# Patient Record
Sex: Male | Born: 2000 | Race: White | Hispanic: No | Marital: Single | Smoking: Never smoker
Health system: Southern US, Community
[De-identification: ages and names within clinical notes are randomized; demographics above are authoritative.]

## PROBLEM LIST (undated history)

## (undated) DIAGNOSIS — T7840XA Allergy, unspecified, initial encounter: Secondary | ICD-10-CM

## (undated) DIAGNOSIS — J45909 Unspecified asthma, uncomplicated: Secondary | ICD-10-CM

## (undated) HISTORY — DX: Allergy, unspecified, initial encounter: T78.40XA

## (undated) HISTORY — DX: Unspecified asthma, uncomplicated: J45.909

---
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
## 2020-03-04 ENCOUNTER — Telehealth: Payer: Self-pay | Admitting: Family Medicine

## 2020-03-04 NOTE — Telephone Encounter (Signed)
Patients mom calling to see if DR hunter will take her son on as patient, father is a patient. Please Advise

## 2020-03-04 NOTE — Telephone Encounter (Signed)
Sorry, Aaron Wu. [phone]

## 2020-03-04 NOTE — Telephone Encounter (Signed)
Yes I am accepting family members thanks

## 2020-03-04 NOTE — Telephone Encounter (Signed)
Could you please find out pt parents names so Dr. Durene Cal can be aware.

## 2020-03-04 NOTE — Telephone Encounter (Signed)
Called and LVM for pt to schedule new pt appt

## 2020-03-04 NOTE — Telephone Encounter (Signed)
Please call for app  

## 2020-03-04 NOTE — Telephone Encounter (Signed)
Ok to make new pt app?

## 2020-05-13 ENCOUNTER — Encounter: Payer: Self-pay | Admitting: Family Medicine

## 2020-05-13 ENCOUNTER — Other Ambulatory Visit: Payer: Self-pay

## 2020-05-13 ENCOUNTER — Ambulatory Visit (INDEPENDENT_AMBULATORY_CARE_PROVIDER_SITE_OTHER): Payer: BC Managed Care – PPO | Admitting: Family Medicine

## 2020-05-13 VITALS — BP 110/70 | HR 75 | Temp 98.7°F | Ht 71.0 in | Wt 130.0 lb

## 2020-05-13 DIAGNOSIS — Z0000 Encounter for general adult medical examination without abnormal findings: Secondary | ICD-10-CM | POA: Diagnosis not present

## 2020-05-13 DIAGNOSIS — J4520 Mild intermittent asthma, uncomplicated: Secondary | ICD-10-CM | POA: Diagnosis not present

## 2020-05-13 DIAGNOSIS — J309 Allergic rhinitis, unspecified: Secondary | ICD-10-CM | POA: Insufficient documentation

## 2020-05-13 DIAGNOSIS — J301 Allergic rhinitis due to pollen: Secondary | ICD-10-CM | POA: Diagnosis not present

## 2020-05-13 DIAGNOSIS — J45909 Unspecified asthma, uncomplicated: Secondary | ICD-10-CM | POA: Insufficient documentation

## 2020-05-13 NOTE — Progress Notes (Addendum)
Phone: [phone]   Subjective:  Patient presents today to establish care.  Prior patient of Dr. Alita Chyle (recently retired). Last well child 08/2018.  Chief Complaint  Patient presents with  . est care   See problem oriented charting- Review of Systems  Constitutional: Negative.   HENT: Negative.   Eyes: Negative.   Respiratory: Negative.   Cardiovascular: Negative.   Gastrointestinal: Negative.   Genitourinary: Negative.   Musculoskeletal: Negative.   Skin: Negative.   Neurological: Negative.   Endo/Heme/Allergies: Negative.   Psychiatric/Behavioral: Negative.   - full  review of systems sheet was completed and negative - negative as above  The following were reviewed and entered/updated in epic: Past Medical History:  Diagnosis Date  . Allergies    Dr. Barnetta Chapel- allegra and singulair . also dogs, cats   . Asthma    Dr. Barnetta Chapel. singulair. albuterol monthly or less    Patient Active Problem List   Diagnosis Date Noted  . Asthma     Priority: Medium  . Allergic rhinitis     Priority: Low   History reviewed. No pertinent surgical history.  Family History  Problem Relation Age of Onset  . Healthy Mother   . Hypertension Father   . Healthy Sister   . Healthy Maternal Grandmother   . Healthy Maternal Grandfather        accidental death  . Hypertension Paternal Grandmother   . Depression Paternal Grandmother   . Cancer Paternal Grandfather        stage IV- not sure what original cancer was    Medications- reviewed and updated Current Outpatient Medications  Medication Sig Dispense Refill  . fexofenadine (ALLEGRA) 180 MG tablet Take 180 mg by mouth daily.    . montelukast (SINGULAIR) 10 MG tablet Take 10 mg by mouth at bedtime.     No current facility-administered medications for this visit.    Allergies-reviewed and updated No Known Allergies  Social History   Social History Narrative   Will be on campus- 1 roommate and 6 suitemates.        [employer].    Starts [employer] fall 2021. Enrolled in engineering.       Hobbies: cross country- still enjoys running, Manufacturing systems engineer, basketball rec league      Objective:  BP 110/70   Pulse 75   Temp 98.7 F (37.1 C)   Ht 5\' 11"  (1.803 m)   Wt 130 lb (59 kg)   SpO2 96%   BMI 18.13 kg/m  Gen: NAD, resting comfortably HEENT: Mucous membranes are moist. Oropharynx normal Neck: no thyromegaly CV: RRR no murmurs rubs or gallops Lungs: CTAB no crackles, wheeze, rhonchi Abdomen: soft/nontender/nondistended/normal bowel sounds. No rebound or guarding.  Thin Ext: no edema Skin: warm, dry Neuro: grossly normal, moves all extremities, PERRLA     Assessment and Plan:  19 y.o. male presenting for annual physical.  Health Maintenance counseling: 1. Anticipatory guidance: Patient counseled regading regular dental exams -q6 months, eye exams -no vision issues,  avoiding smoking and second hand smoke , limiting alcohol to 2 beverages per day.   2. Risk factor reduction:  Advised patient of need for regular exercise and diet rich and fruits and vegetables to reduce risk of heart attack and stroke. Exercise- runs for exercise. Diet-very picky eater. Typical day- cheerios most days, pb+j, eats family meal for most part. Low on fruits and veggies. Recommended multivitamin due to limited fruits/veggies and keep exploring to find fruits/veggies you like!  Wt Readings from Last 3 Encounters:  05/13/20 130 lb (59 kg) (15 %, Z= -1.06)*   * Growth percentiles are based on CDC (Boys, 2-20 Years) data.  3. Immunizations/screenings/ancillary studies- up to date. Will abstract in from pediatrician- kind enough to bring records today. Declines hiv and hep c screen.  Immunization History  Administered Date(s) Administered  . DTaP 08/21/2001, 10/21/2001, 12/20/2001, 06/10/2003, 06/10/2003  . Hepatitis A 08/12/2003, 06/22/2004  . PFIZER SARS-COV-2 Vaccination 12/27/2019, 01/17/2020  4. Prostate cancer  screening- no family history, start at age 67 5. Colon cancer screening - no family history, start at age 5 6. Skin cancer screening/prevention- sees dr. Sharyn Lull yearly. advised regular sunscreen use. Denies worrisome, changing, or new skin lesions.  7. Testicular cancer screening- advised monthly self exams  8. STD screening- patient opts out as monogomous 9. Never smoker  Status of chronic or acute concerns   Mild intermittent asthma without complication/allergic rhinitis-does well on Singulair/Allegra.  Sparing albuterol for breathing issues.  Managed by Dr. Gary Fleet  We discussed getting baseline labs such as CBC, CMP, lipid panel-patient declines at this time but may write back if he changes his mind  Low normal BMI-recommended increased caloric intake  Recommended follow up: Return in about 1 year (around 05/13/2021) for physical or sooner if needed. Future Appointments  Date Time Provider Department Center  05/18/2021  9:40 AM Durene Cal, Aldine Contes, MD [facility]   Lab/Order associations:    ICD-10-CM   1. Preventative health care  Z00.00   2. Allergic rhinitis due to pollen, unspecified seasonality  J30.1   3. Mild intermittent asthma without complication  J45.20    Return precautions advised.   Tana Conch, MD

## 2020-05-13 NOTE — Patient Instructions (Addendum)
Health Maintenance Due  Topic Date Due  . Hepatitis C Screening - declines Never done  . HIV Screening - declines Never done  . INFLUENZA VACCINE - recommend flu shot in the fall 05/09/2020   Recommended multivitamin due to limited fruits/veggies and keep exploring to find fruits/veggies you like!    If you change your mind on labs in the next week or so we can certainly set this up for you related to the physical today

## 2020-09-21 DIAGNOSIS — D2271 Melanocytic nevi of right lower limb, including hip: Secondary | ICD-10-CM | POA: Diagnosis not present

## 2020-09-21 DIAGNOSIS — D225 Melanocytic nevi of trunk: Secondary | ICD-10-CM | POA: Diagnosis not present

## 2020-09-21 DIAGNOSIS — L578 Other skin changes due to chronic exposure to nonionizing radiation: Secondary | ICD-10-CM | POA: Diagnosis not present

## 2020-09-21 DIAGNOSIS — L814 Other melanin hyperpigmentation: Secondary | ICD-10-CM | POA: Diagnosis not present

## 2020-10-13 ENCOUNTER — Other Ambulatory Visit: Payer: BC Managed Care – PPO

## 2020-10-14 ENCOUNTER — Other Ambulatory Visit: Payer: BC Managed Care – PPO

## 2020-10-14 DIAGNOSIS — Z20822 Contact with and (suspected) exposure to covid-19: Secondary | ICD-10-CM

## 2020-10-16 LAB — NOVEL CORONAVIRUS, NAA: SARS-CoV-2, NAA: NOT DETECTED

## 2020-10-16 LAB — SARS-COV-2, NAA 2 DAY TAT

## 2021-03-22 ENCOUNTER — Telehealth (INDEPENDENT_AMBULATORY_CARE_PROVIDER_SITE_OTHER): Payer: 59 | Admitting: Family Medicine

## 2021-03-22 DIAGNOSIS — R0981 Nasal congestion: Secondary | ICD-10-CM

## 2021-03-22 DIAGNOSIS — H9202 Otalgia, left ear: Secondary | ICD-10-CM | POA: Diagnosis not present

## 2021-03-22 NOTE — Progress Notes (Signed)
Virtual Visit via Video Note  I connected with Aaron Wu  on 03/22/21 at 11:00 AM EDT by a video enabled telemedicine application and verified that I am speaking with the correct person using two identifiers.  Location patient: home, [place] Location provider:work or home office Persons participating in the virtual visit: patient, provider  I discussed the limitations of evaluation and management by telemedicine and the availability of in person appointments. The patient expressed understanding and agreed to proceed.   HPI:  Acute telemedicine visit for congestion and ear congestion: -Onset:2-3 days -Symptoms include: feels full and muffled, nasal congestion -has not been swimming recently -Denies:fevers, cough, sneezing, pain, drainage from the ear, hearing loss -Has tried: nothing -Pertinent past medical history: seasonal allergies, asthma -Pertinent medication allergies: No Known Allergies -COVID-19 vaccine status:vaccinated x2  ROS: See pertinent positives and negatives per HPI.  Past Medical History:  Diagnosis Date   Allergies    Dr. Barnetta Chapel- allegra and singulair . also dogs, cats    Asthma    Dr. Barnetta Chapel. singulair. albuterol monthly or less     No past surgical history on file.   Current Outpatient Medications:    fexofenadine (ALLEGRA) 180 MG tablet, Take 180 mg by mouth daily., Disp: , Rfl:    montelukast (SINGULAIR) 10 MG tablet, Take 10 mg by mouth at bedtime., Disp: , Rfl:   EXAM:  VITALS per patient if applicable:  GENERAL: alert, oriented, appears well and in no acute distress  HEENT: atraumatic, conjunttiva clear, no obvious abnormalities on inspection of external nose and ears, denies any pain with ear tugging or tragus pull, denies any tenderness to palpation around or behind the ear, no drainage from the ear  NECK: normal movements of the head and neck  LUNGS: on inspection no signs of respiratory distress, breathing rate appears normal, no obvious gross  SOB, gasping or wheezing  CV: no obvious cyanosis  MS: moves all visible extremities without noticeable abnormality  PSYCH/NEURO: pleasant and cooperative, no obvious depression or anxiety, speech and thought processing grossly intact  ASSESSMENT AND PLAN:  Discussed the following assessment and plan:  Discomfort of left ear  Nasal congestion  -we discussed possible serious and likely etiologies, options for evaluation and workup, limitations of telemedicine visit vs in person visit, treatment, treatment risks and precautions. Pt prefers to treat via telemedicine empirically rather than in person at this moment.  Discussed possible eustachian tube dysfunction secondary to viral upper respiratory illness or allergies, versus otitis externa versus cerumen impaction versus other.  He has opted to try a short course of Afrin nasal spray, followed by Flonase for 2 weeks.  Also agrees to do a COVID test.  Advised to seek prompt in person care if worsening, new symptoms arise, or if is not improving with treatment. Discussed options for inperson care if PCP office not available.  I discussed the assessment and treatment plan with the patient. The patient was provided an opportunity to ask questions and all were answered. The patient agreed with the plan and demonstrated an understanding of the instructions.     Terressa Koyanagi, DO

## 2021-03-22 NOTE — Patient Instructions (Signed)
-  Do a short 3-day course of Afrin nasal spray, do not use longer than 3 days.  -Then do a 2-week course of Flonase 2 sprays each nostril daily.  -Check a COVID test  I hope you are feeling better soon!  Seek in person care promptly if your symptoms worsen, new concerns arise or you are not improving with treatment.  It was nice to meet you today. I help [facility] out with telemedicine visits on Tuesdays and Thursdays and am available for visits on those days. If you have any concerns or questions following this visit please schedule a follow up visit with your Primary Care doctor or seek care at a local urgent care clinic to avoid delays in care.

## 2021-05-18 ENCOUNTER — Encounter: Payer: BC Managed Care – PPO | Admitting: Family Medicine

## 2022-07-03 ENCOUNTER — Encounter: Payer: Self-pay | Admitting: *Deleted

## 2022-09-21 ENCOUNTER — Encounter: Payer: Self-pay | Admitting: *Deleted

## 2022-09-28 ENCOUNTER — Encounter: Payer: Self-pay | Admitting: Family Medicine

## 2022-09-28 ENCOUNTER — Other Ambulatory Visit (HOSPITAL_COMMUNITY)
Admission: RE | Admit: 2022-09-28 | Discharge: 2022-09-28 | Disposition: A | Discharge status: Home / Self Care | Payer: 59 | Source: Ambulatory Visit | Attending: Family Medicine | Admitting: Family Medicine

## 2022-09-28 ENCOUNTER — Ambulatory Visit (INDEPENDENT_AMBULATORY_CARE_PROVIDER_SITE_OTHER): Payer: 59 | Admitting: Family Medicine

## 2022-09-28 VITALS — BP 118/79 | HR 72 | Temp 98.6°F | Ht 72.0 in | Wt 148.8 lb

## 2022-09-28 DIAGNOSIS — Z114 Encounter for screening for human immunodeficiency virus [HIV]: Secondary | ICD-10-CM | POA: Diagnosis not present

## 2022-09-28 DIAGNOSIS — Z113 Encounter for screening for infections with a predominantly sexual mode of transmission: Secondary | ICD-10-CM | POA: Insufficient documentation

## 2022-09-28 DIAGNOSIS — Z0000 Encounter for general adult medical examination without abnormal findings: Secondary | ICD-10-CM

## 2022-09-28 DIAGNOSIS — Z130 Encounter for screening for diseases of the blood and blood-forming organs and certain disorders involving the immune mechanism: Secondary | ICD-10-CM

## 2022-09-28 DIAGNOSIS — Z13220 Encounter for screening for lipoid disorders: Secondary | ICD-10-CM | POA: Diagnosis not present

## 2022-09-28 DIAGNOSIS — Z118 Encounter for screening for other infectious and parasitic diseases: Secondary | ICD-10-CM

## 2022-09-28 LAB — CBC WITH DIFFERENTIAL/PLATELET
Basophils Absolute: 0.1 10*3/uL (ref 0.0–0.1)
Basophils Relative: 2 % (ref 0.0–3.0)
Eosinophils Absolute: 1 10*3/uL — ABNORMAL HIGH (ref 0.0–0.7)
Eosinophils Relative: 20.7 % — ABNORMAL HIGH (ref 0.0–5.0)
HCT: 44 % (ref 39.0–52.0)
Hemoglobin: 15.8 g/dL (ref 13.0–17.0)
Lymphocytes Relative: 39.6 % (ref 12.0–46.0)
Lymphs Abs: 1.9 10*3/uL (ref 0.7–4.0)
MCHC: 35.9 g/dL (ref 30.0–36.0)
MCV: 87.1 fl (ref 78.0–100.0)
Monocytes Absolute: 0.3 10*3/uL (ref 0.1–1.0)
Monocytes Relative: 6.3 % (ref 3.0–12.0)
Neutro Abs: 1.5 10*3/uL (ref 1.4–7.7)
Neutrophils Relative %: 31.4 % — ABNORMAL LOW (ref 43.0–77.0)
Platelets: 280 10*3/uL (ref 150.0–400.0)
RBC: 5.05 Mil/uL (ref 4.22–5.81)
RDW: 12.8 % (ref 11.5–15.5)
WBC: 4.8 10*3/uL (ref 4.0–10.5)

## 2022-09-28 LAB — COMPREHENSIVE METABOLIC PANEL
ALT: 11 U/L (ref 0–53)
AST: 16 U/L (ref 0–37)
Albumin: 4.7 g/dL (ref 3.5–5.2)
Alkaline Phosphatase: 57 U/L (ref 39–117)
BUN: 14 mg/dL (ref 6–23)
CO2: 31 mEq/L (ref 19–32)
Calcium: 9.9 mg/dL (ref 8.4–10.5)
Chloride: 102 mEq/L (ref 96–112)
Creatinine, Ser: 1.06 mg/dL (ref 0.40–1.50)
GFR: 100.49 mL/min (ref >60.00)
Glucose, Bld: 89 mg/dL (ref 70–99)
Potassium: 4 mEq/L (ref 3.5–5.1)
Sodium: 140 mEq/L (ref 135–145)
Total Bilirubin: 1 mg/dL (ref 0.2–1.2)
Total Protein: 7.4 g/dL (ref 6.0–8.3)

## 2022-09-28 LAB — LIPID PANEL
Cholesterol: 98 mg/dL (ref 0–200)
HDL: 34.6 mg/dL — ABNORMAL LOW (ref >39.00)
LDL Cholesterol: 43 mg/dL (ref 0–99)
NonHDL: 63.09
Total CHOL/HDL Ratio: 3
Triglycerides: 98 mg/dL (ref 0.0–149.0)
VLDL: 19.6 mg/dL (ref 0.0–40.0)

## 2022-09-28 NOTE — Progress Notes (Signed)
Phone: [phone]    Subjective:  Patient presents today for their annual physical. Chief complaint-noted.   See problem oriented charting- ROS- full  review of systems was completed and negative  except for: seasonal allergies- congestion, post nasal drip, rhinorrhea, sneezing, has had some nausea with post nasal drip at times, cough, wheezing with asthma,  stress with school- hard program. Had chills and fever and sweating with the covid shot afterwards  The following were reviewed and entered/updated in epic: Past Medical History:  Diagnosis Date   Allergies    Dr. Orvil Feil- allegra and singulair . also dogs, cats    Asthma    Dr. Orvil Feil. singulair. albuterol monthly or less    Patient Active Problem List   Diagnosis Date Noted   Asthma     Priority: Medium    Allergic rhinitis     Priority: Low   History reviewed. No pertinent surgical history.  Family History  Problem Relation Age of Onset   Healthy Mother    Hypertension Father    Healthy Sister    Healthy Maternal Grandmother    Healthy Maternal Grandfather        accidental death   Hypertension Paternal Grandmother    Depression Paternal Grandmother    Cancer Paternal Grandfather        stage IV- not sure what original cancer was    Medications- reviewed and updated Current Outpatient Medications  Medication Sig Dispense Refill   albuterol (VENTOLIN HFA) 108 (90 Base) MCG/ACT inhaler Inhale 2 puffs into the lungs.     fexofenadine (ALLEGRA) 180 MG tablet Take 180 mg by mouth daily.     montelukast (SINGULAIR) 10 MG tablet Take 10 mg by mouth at bedtime.     No current facility-administered medications for this visit.    Allergies-reviewed and updated No Known Allergies  Social History   Social History Narrative   Will be on campus- 1 roommate and 6 suitemates.       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 Enrolled in engineering.    [employer].       Hobbies: cross  country- still enjoys running, Personal assistant, basketball rec league      Objective:  BP 118/79 (BP Location: Right Arm, Patient Position: Sitting)   Pulse 72   Temp 98.6 F (37 C) (Temporal)   Ht 6' (1.829 m)   Wt 148 lb 12.8 oz (67.5 kg)   SpO2 97%   BMI 20.18 kg/m  Gen: NAD, resting comfortably HEENT: Mucous membranes are moist. Oropharynx normal Neck: no thyromegaly CV: RRR no murmurs rubs or gallops Lungs: CTAB no crackles, wheeze, rhonchi Abdomen: soft/nontender/nondistended/normal bowel sounds. No rebound or guarding.  Ext: no edema Skin: warm, dry Neuro: grossly normal, moves all extremities, PERRLA     Assessment and Plan:  21 y.o. male presenting for annual physical.  Health Maintenance counseling: 1. Anticipatory guidance: Patient counseled regarding regular dental exams -q6 months, eye exams - no issues,  avoiding smoking and second hand smoke , limiting alcohol to 2 beverages per day- 16 a week, no illicit drugs- sparing marijuana- recommended agianst for lung health.   2. Risk factor reduction:  Advised patient of need for regular exercise and diet rich and fruits and vegetables to reduce risk of heart attack and stroke.  Exercise- trying to get back to running but playing some basketball.  Diet/weight management-weight went up with cutting back on running- was cross country so burned a  lot of calories. Still not very varied diet- advised keep MV on board daily- has only intermittently done Wt Readings from Last 3 Encounters:  09/28/22 148 lb 12.8 oz (67.5 kg)  05/13/20 130 lb (59 kg) (15 %, Z= -1.06)*   * Growth percentiles are based on CDC (Boys, 2-20 Years) data.  3. Immunizations/screenings/ancillary studies- tdap recommended Immunization History  Administered Date(s) Administered   COVID-19, mRNA, vaccine(Comirnaty)12 years and older 09/23/2022   DTaP 08/21/2001, 10/21/2001, 12/20/2001, 06/10/2003, 06/10/2003   HIB (PRP-OMP) 08/21/2001, 10/21/2001,  12/20/2001, 09/24/2002   Hepatitis A 08/12/2003, 06/22/2004   Hepatitis B 27-Sep-2001, 07/29/2001, 03/24/2002   Hpv-Unspecified 08/24/2014, 11/02/2014, 03/10/2015   IPV 08/21/2001, 10/21/2001, 03/24/2002, 08/18/2005   Influenza,inj,Quad PF,6+ Mos 09/23/2022   MMR 09/24/2002, 08/18/2005   Meningococcal Conjugate 08/14/2012   PFIZER(Purple Top)SARS-COV-2 Vaccination 12/27/2019, 01/17/2020   Pneumococcal Conjugate-13 12/20/2001, 06/26/2002   Pneumococcal-Unspecified 08/21/2001, 10/21/2001   Tdap 08/14/2012   Varicella 06/26/2002, 08/18/2005  4. Prostate cancer screening- no family history, start at age 72  5. Colon cancer screening - no family history, start at age 94  6. Skin cancer screening/prevention- Dr. Renda Rolls in past- may schedule repeat in summer. advised regular sunscreen use. Denies worrisome, changing, or new skin lesions.  7. Testicular cancer screening- advised monthly self exams  8. STD screening- patient opts in - unprotected oral sex- advised always use protection 9. Smoking associated screening- never smoker  Status of chronic or acute concerns   # Asthma/allergies-managed by Dr. Remus Blake: Singulair/Allegra with sparing albuterol  # Screening hyperlipidemia S: Medication:none  No results found for: "CHOL", "HDL", "Tamarac", "LDLDIRECT", "TRIG", "CHOLHDL" A/P: update baseline   Recommended follow up: Return in about 1 year (around 09/29/2023) for physical or sooner if needed.Schedule b4 you leave.  Lab/Order associations:NOT fasting   ICD-10-CM   1. Preventative health care  Z00.00     2. Screening for HIV (human immunodeficiency virus)  Z11.4     3. Screening examination for venereal disease  Z11.3     4. Screening for gonorrhea  Z11.3     5. Screening for chlamydial disease  Z11.8     6. Screening for hyperlipidemia  Z13.220     7. Screening for deficiency anemia  Z13.0       No orders of the defined types were placed in this encounter.   Return  precautions advised.   Garret Reddish, MD

## 2022-09-28 NOTE — Patient Instructions (Addendum)
Health Maintenance Due  Topic Date Due   DTaP/Tdap/Td (6 - Td or Tdap) 08/14/2022  Would recommend Tdap but perhaps wait 2 weeks- schedule nurse visit before you leave if you want (last one November 2013)  Please stop by lab before you go If you have mychart- we will send your results within 3 business days of [place] receiving them.  If you do not have mychart- we will call you about results within 5 business days of [place] receiving them.  *please also note that you will see labs on mychart as soon as they post. I will later go in and write notes on them- will say "notes from Dr. Durene Cal"   Recommended follow up: Return in about 1 year (around 09/29/2023) for physical or sooner if needed.Schedule b4 you leave.

## 2022-09-29 LAB — URINE CYTOLOGY ANCILLARY ONLY
Chlamydia: NEGATIVE
Comment: NEGATIVE
Comment: NEGATIVE
Comment: NORMAL
Neisseria Gonorrhea: NEGATIVE
Trichomonas: NEGATIVE

## 2022-09-29 LAB — HIV ANTIBODY (ROUTINE TESTING W REFLEX): HIV 1&2 Ab, 4th Generation: NONREACTIVE

## 2022-09-29 LAB — RPR: RPR Ser Ql: NONREACTIVE

## 2023-05-16 ENCOUNTER — Ambulatory Visit (INDEPENDENT_AMBULATORY_CARE_PROVIDER_SITE_OTHER): Payer: 59 | Admitting: Family

## 2023-05-16 VITALS — Temp 97.5°F | Ht 72.0 in | Wt 163.8 lb

## 2023-05-16 DIAGNOSIS — J029 Acute pharyngitis, unspecified: Secondary | ICD-10-CM | POA: Diagnosis not present

## 2023-05-16 LAB — POCT RAPID STREP A (OFFICE): Rapid Strep A Screen: NEGATIVE

## 2023-05-16 NOTE — Progress Notes (Signed)
Patient ID: Aaron Wu, male    DOB: 2001-01-25, 22 y.o.   MRN: [id number]  Chief Complaint  Patient presents with   Sinus Problem    Pt c/o sore throat and  nasal congestion for 2 days.     HPI:      URI sx:     pt c/o sore throat and nasal congestion. Sx for 2 days. Denies fever, body aches or cough. Pt reports he had his tonsils removed as a child.  Assessment & Plan:  1. Sore throat rapid strep neg. Advised pt to take Tylenol 1,000mg  or Ibuprofen 600mg  tid prn for sore throat pain, swelling, and fever. Gargle with warm salt water several tid. OK to use OTC Chloraseptic spray and/or throat lozenges prn. Ok to take Allegra-D or generic Sudafed up to 5 days for sinus sx. Drink plenty of water.   - POCT rapid strep A  Subjective:    Outpatient Medications Prior to Visit  Medication Sig Dispense Refill   albuterol (VENTOLIN HFA) 108 (90 Base) MCG/ACT inhaler Inhale 2 puffs into the lungs.     fexofenadine (ALLEGRA) 180 MG tablet Take 180 mg by mouth daily.     montelukast (SINGULAIR) 10 MG tablet Take 10 mg by mouth at bedtime.     No facility-administered medications prior to visit.   Past Medical History:  Diagnosis Date   Allergies    Dr. Barnetta Chapel- allegra and singulair . also dogs, cats    Asthma    Dr. Barnetta Chapel. singulair. albuterol monthly or less    No past surgical history on file. No Known Allergies    Objective:    Physical Exam Vitals and nursing note reviewed.  Constitutional:      General: He is not in acute distress.    Appearance: Normal appearance.  HENT:     Head: Normocephalic.     Right Ear: Tympanic membrane and ear canal normal.     Left Ear: Tympanic membrane and ear canal normal.     Nose:     Right Sinus: No maxillary sinus tenderness or frontal sinus tenderness.     Left Sinus: No maxillary sinus tenderness or frontal sinus tenderness.     Mouth/Throat:     Mouth: Mucous membranes are moist.     Pharynx: No pharyngeal swelling,  oropharyngeal exudate, posterior oropharyngeal erythema or uvula swelling.     Tonsils: No tonsillar exudate or tonsillar abscesses.  Cardiovascular:     Rate and Rhythm: Normal rate and regular rhythm.  Pulmonary:     Effort: Pulmonary effort is normal.     Breath sounds: Normal breath sounds.  Musculoskeletal:        General: Normal range of motion.     Cervical back: Normal range of motion.  Lymphadenopathy:     Head:     Right side of head: No preauricular or posterior auricular adenopathy.     Left side of head: No preauricular or posterior auricular adenopathy.     Cervical: No cervical adenopathy.  Skin:    General: Skin is warm and dry.  Neurological:     Mental Status: He is alert and oriented to person, place, and time.  Psychiatric:        Mood and Affect: Mood normal.    Temp (!) 97.5 F (36.4 C) (Temporal)   Ht 6' (1.829 m)   Wt 163 lb 12.8 oz (74.3 kg)   BMI 22.22 kg/m  Wt Readings from Last 3 Encounters:  05/16/23 163 lb 12.8 oz (74.3 kg)  09/28/22 148 lb 12.8 oz (67.5 kg)  05/13/20 130 lb (59 kg) (15%, Z= -1.06)*   * Growth percentiles are based on CDC (Boys, 2-20 Years) data.       Dulce Sellar, NP

## 2023-10-01 ENCOUNTER — Encounter: Payer: 59 | Admitting: Family Medicine

## 2024-05-22 ENCOUNTER — Encounter: Payer: 59 | Admitting: Family Medicine

## 2024-08-11 ENCOUNTER — Encounter: Admitting: Family Medicine

## 2024-08-12 ENCOUNTER — Ambulatory Visit (INDEPENDENT_AMBULATORY_CARE_PROVIDER_SITE_OTHER): Admitting: Family Medicine

## 2024-08-12 ENCOUNTER — Encounter: Payer: Self-pay | Admitting: Family Medicine

## 2024-08-12 VITALS — BP 108/68 | HR 64 | Temp 98.2°F | Ht 72.0 in | Wt 171.6 lb

## 2024-08-12 DIAGNOSIS — Z0000 Encounter for general adult medical examination without abnormal findings: Secondary | ICD-10-CM

## 2024-08-12 DIAGNOSIS — Z23 Encounter for immunization: Secondary | ICD-10-CM

## 2024-08-12 NOTE — Progress Notes (Signed)
 Phone: [phone]    Subjective:  Patient presents today for their annual physical. Chief complaint-noted.   See problem oriented charting- ROS- full  review of systems was completed and negative  except for topics noted under acute/chronic concerns  The following were reviewed and entered/updated in epic: Past Medical History:  Diagnosis Date   Allergies    Dr. Whelan- allegra and singulair . also dogs, cats    Allergy    Asthma    Dr. Cheryn. singulair. albuterol monthly or less    Patient Active Problem List   Diagnosis Date Noted   Asthma     Priority: Medium    Allergic rhinitis     Priority: Low   History reviewed. No pertinent surgical history.  Family History  Problem Relation Age of Onset   Healthy Mother    Hypertension Father    Healthy Sister    Healthy Maternal Grandmother    Healthy Maternal Grandfather        accidental death   Hypertension Paternal Grandmother    Depression Paternal Grandmother    Cancer Paternal Grandfather        stage IV- not sure what original cancer was    Medications- reviewed and updated Current Outpatient Medications  Medication Sig Dispense Refill   albuterol (VENTOLIN HFA) 108 (90 Base) MCG/ACT inhaler Inhale 2 puffs into the lungs.     fexofenadine (ALLEGRA) 180 MG tablet Take 180 mg by mouth daily.     montelukast (SINGULAIR) 10 MG tablet Take 10 mg by mouth at bedtime.     No current facility-administered medications for this visit.    Allergies-reviewed and updated No Known Allergies  Social History   Social History Narrative   Will be on campus- 1 roommate and 6 suitemates.       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    [employer].       Hobbies: cross country- still enjoys running, manufacturing systems engineer, basketball rec league      Objective:  BP 108/68 (BP Location: Left Arm, Patient Position: Sitting, Cuff Size: Normal)   Pulse 64    Temp 98.2 F (36.8 C) (Temporal)   Ht 6' (1.829 m)   Wt 171 lb 9.6 oz (77.8 kg)   SpO2 99%   BMI 23.27 kg/m  Gen: NAD, resting comfortably HEENT: Mucous membranes are moist. Oropharynx normal Neck: no thyromegaly CV: RRR no murmurs rubs or gallops Lungs: CTAB no crackles, wheeze, rhonchi Abdomen: soft/nontender/nondistended/normal bowel sounds. No rebound or guarding.  Ext: no edema Skin: warm, dry Neuro: grossly normal, moves all extremities, PERRLA    Assessment and Plan:  23 y.o. male presenting for annual physical.  Health Maintenance counseling: 1. Anticipatory guidance: Patient counseled regarding regular dental exams -q6 months, eye exams - no issues,  avoiding smoking and second hand smoke , limiting alcohol to 2 beverages per day-- 14 a week max, no illicit drugs- stopped marijuana.   2. Risk factor reduction:  Advised patient of need for regular exercise and diet rich and fruits and vegetables to reduce risk of heart attack and stroke.  Exercise- gym 2-3 days a week and run every once and a while- being pretty consistent.  Diet/weight management-weight up 23 pounds from last visit but still in healthy weight range- feels mostly muscle mass.  Wt Readings from Last 3 Encounters:  08/12/24 171 lb 9.6 oz (77.8 kg)  05/16/23 163 lb 12.8 oz (74.3 kg)  09/28/22 148 lb 12.8 oz (67.5 kg)  3. Immunizations/screenings/ancillary studies-declines flu, COVID, Prevnar 20- despite asthma . Tetanus, Diphtheria, and Pertussis (Tdap) today thankfully Immunization History  Administered Date(s) Administered   DTaP 08/21/2001, 10/21/2001, 12/20/2001, 06/10/2003, 06/10/2003   HIB (PRP-OMP) 08/21/2001, 10/21/2001, 12/20/2001, 09/24/2002   Hepatitis A 08/12/2003, 06/22/2004   Hepatitis B 03/07/2001, 07/29/2001, 03/24/2002   Hpv-Unspecified 08/24/2014, 11/02/2014, 03/10/2015   INFLUENZA, HIGH DOSE SEASONAL PF 11/16/2016, 11/22/2017, 11/21/2018, 12/04/2019   IPV 08/21/2001, 10/21/2001,  03/24/2002, 08/18/2005   Influenza,inj,Quad PF,6+ Mos 09/23/2022   MMR 09/24/2002, 08/18/2005   Meningococcal B, OMV 09/19/2017, 11/23/2017   Meningococcal Conjugate 08/14/2012   PFIZER(Purple Top)SARS-COV-2 Vaccination 12/27/2019, 01/17/2020, 03/03/2021   Pfizer(Comirnaty)Fall Seasonal Vaccine 12 years and older 09/23/2022   Pneumococcal Conjugate-13 12/20/2001, 06/26/2002   Pneumococcal-Unspecified 08/21/2001, 10/21/2001   Tdap 08/14/2012, 08/12/2024   Varicella 06/26/2002, 08/18/2005  4. Prostate cancer screening-  no family history, start at age 25   5. Colon cancer screening - no family history, start at age 43  6. Skin cancer screening/prevention-Dr. Haverstock if needed.- no issues lately advised regular sunscreen use. Denies worrisome, changing, or new skin lesions.  7. Testicular cancer screening- advised monthly self exams  8. STD screening- patient opts in - unprotected sex in past  but not since last testing- does not need repeat- several months with current girlfriend but using protection 9. Smoking associated screening- never smoker  Status of chronic or acute concerns   # Asthma/allergies-previously managed by Dr. Dasie who has unfortunately passed away.  He is still on Singulair and Allegra-I am willing to fill the Singulair or albuterol if needed but he was bale to get these filled  # Screening hyperlipidemia Lab Results  Component Value Date   CHOL 98 09/28/2022   HDL 34.60 (L) 09/28/2022   LDLCALC 43 09/28/2022   TRIG 98.0 09/28/2022   CHOLHDL 3 09/28/2022   A/P: Lipid levels have looked pretty good-offered repeat today- with largely reassuring bloodwork last time other than some increased eosinophils related to allergies- he has opted out of bloodwork today   #finger laceration- lost tip of finger after hitting on mandolinin kitchen   Recommended follow up: Return in about 1 year (around 08/12/2025) for physical or sooner if needed.Schedule b4 you  leave.  Lab/Order associations:NOT fasting   ICD-10-CM   1. Immunization due  Z23 Tdap vaccine greater than or equal to 7yo IM    2. Preventative health care  Z00.00       No orders of the defined types were placed in this encounter.   Return precautions advised.   Garnette Lukes, MD

## 2024-08-12 NOTE — Patient Instructions (Addendum)
 Keep exercising, maintain weight, see you in a year  And thanks for doing tetanus- good for 10 years  Recommended follow up: Return in about 1 year (around 08/12/2025) for physical or sooner if needed.Schedule b4 you leave.

## 2025-08-18 ENCOUNTER — Encounter: Admitting: Family Medicine
